# Patient Record
Sex: Female | Born: 1980 | Hispanic: No | Marital: Married | Smoking: Former smoker
Health system: Southern US, Community
[De-identification: ages and names within clinical notes are randomized; demographics above are authoritative.]

## PROBLEM LIST (undated history)

## (undated) ENCOUNTER — Inpatient Hospital Stay (HOSPITAL_COMMUNITY): Payer: Self-pay

## (undated) DIAGNOSIS — G43909 Migraine, unspecified, not intractable, without status migrainosus: Secondary | ICD-10-CM

## (undated) DIAGNOSIS — Z789 Other specified health status: Secondary | ICD-10-CM

## (undated) HISTORY — PX: APPENDECTOMY: SHX54

## (undated) HISTORY — PX: ADENOIDECTOMY: SUR15

## (undated) HISTORY — DX: Migraine, unspecified, not intractable, without status migrainosus: G43.909

## (undated) HISTORY — PX: TONSILLECTOMY: SUR1361

---

## 2010-06-29 ENCOUNTER — Emergency Department (HOSPITAL_COMMUNITY)
Admission: EM | Admit: 2010-06-29 | Discharge: 2010-06-30 | Payer: Self-pay | Source: Home / Self Care | Admitting: Emergency Medicine

## 2010-10-07 LAB — URINALYSIS, ROUTINE W REFLEX MICROSCOPIC
Glucose, UA: NEGATIVE mg/dL
Hgb urine dipstick: NEGATIVE
Protein, ur: NEGATIVE mg/dL
Specific Gravity, Urine: 1.011 (ref 1.005–1.030)

## 2010-10-07 LAB — POCT PREGNANCY, URINE: Preg Test, Ur: NEGATIVE

## 2011-01-14 LAB — RUBELLA ANTIBODY, IGM: Rubella: IMMUNE

## 2011-01-14 LAB — ABO/RH: RH Type: POSITIVE

## 2011-01-14 LAB — ANTIBODY SCREEN: Antibody Screen: NEGATIVE

## 2011-01-14 LAB — HEPATITIS B SURFACE ANTIGEN: Hepatitis B Surface Ag: NEGATIVE

## 2011-06-10 LAB — HIV ANTIBODY (ROUTINE TESTING W REFLEX): HIV: NONREACTIVE

## 2011-06-10 LAB — RUBELLA ANTIBODY, IGM: Rubella: IMMUNE

## 2011-06-10 LAB — ANTIBODY SCREEN: Antibody Screen: NEGATIVE

## 2011-06-10 LAB — RPR: RPR: NONREACTIVE

## 2011-06-10 LAB — ABO/RH

## 2011-06-10 LAB — HEPATITIS B SURFACE ANTIGEN: Hepatitis B Surface Ag: NEGATIVE

## 2011-07-28 NOTE — L&D Delivery Note (Signed)
Delivery Note At 11:52 AM a viable female was delivered via Vaginal, Spontaneous Delivery (Presentation: Right Occiput Anterior).  APGAR: 8, 9; weight .  8'2 Placenta status: Intact, Spontaneous.  Cord: 3 vessels with the following complications: None.  Cord pH: n/a  Anesthesia: Epidural  Episiotomy: None Lacerations: None Suture Repair: n/a Est. Blood Loss (mL): 350  Mom to postpartum.  Baby to nursery-stable.  Lecretia Buczek A. 09/01/2011, 12:59 PM

## 2011-08-03 LAB — STREP B DNA PROBE: GBS: NEGATIVE

## 2011-08-07 ENCOUNTER — Inpatient Hospital Stay (HOSPITAL_COMMUNITY)
Admission: AD | Admit: 2011-08-07 | Discharge: 2011-08-07 | Disposition: A | Discharge status: Home / Self Care | Payer: BC Managed Care – PPO | Source: Ambulatory Visit | Attending: Obstetrics | Admitting: Obstetrics

## 2011-08-07 ENCOUNTER — Encounter (HOSPITAL_COMMUNITY): Payer: Self-pay | Admitting: *Deleted

## 2011-08-07 DIAGNOSIS — O4700 False labor before 37 completed weeks of gestation, unspecified trimester: Secondary | ICD-10-CM | POA: Insufficient documentation

## 2011-08-07 HISTORY — DX: Other specified health status: Z78.9

## 2011-08-07 NOTE — Progress Notes (Signed)
"  UC's since 1730 that increased in intensity at about 2300.  UC's were every 1-2 mins at 2300 and now seem to be every 5-10 mins."

## 2011-08-07 NOTE — Progress Notes (Signed)
PT SAYS UC SINCE 530PM.  -   UC STOPPED- THEN STARTED AGAIN  AT 2300- CALLED DR .Marland Kitchen UC SLOWED DOWN.  VE ON Monday 1.5 CM.  DENIES HSV AND MRSA.

## 2011-08-30 ENCOUNTER — Encounter (HOSPITAL_COMMUNITY): Payer: Self-pay | Admitting: *Deleted

## 2011-08-30 ENCOUNTER — Inpatient Hospital Stay (HOSPITAL_COMMUNITY)
Admission: AD | Admit: 2011-08-30 | Discharge: 2011-08-30 | Disposition: A | Discharge status: Home / Self Care | Payer: BC Managed Care – PPO | Source: Ambulatory Visit | Attending: Obstetrics & Gynecology | Admitting: Obstetrics & Gynecology

## 2011-08-30 DIAGNOSIS — O99891 Other specified diseases and conditions complicating pregnancy: Secondary | ICD-10-CM | POA: Insufficient documentation

## 2011-08-30 DIAGNOSIS — H9210 Otorrhea, unspecified ear: Secondary | ICD-10-CM | POA: Insufficient documentation

## 2011-08-30 DIAGNOSIS — R42 Dizziness and giddiness: Secondary | ICD-10-CM | POA: Insufficient documentation

## 2011-08-30 NOTE — Progress Notes (Signed)
Has been leaking clear fluid from R ear x3 days.  C/o dizziness when lying down last night and today, hands going numb.

## 2011-08-30 NOTE — ED Provider Notes (Signed)
Heide Guile, RN reported that Dr. Seymour Bars asked that the NP perform an ear exam on Mrs. Minter.  She reports dizziness and liquid discharge from her right ear.   On exam the TMs are bilaterally without redness.  There is no fluid seen in either canal.  She has an appointment in the morning at 10:30.  Encouraged her to mention this visit with Dr. Ernestina Penna at that time.  Jeani Sow, RN FNP  Matt Holmes, NP 08/30/11 [phone]

## 2011-08-30 NOTE — Progress Notes (Signed)
Pt stated she woke up several times last night and felt like she was going to pass out. Arms and hand felt numb. Has bee very restless today and tried to take a nap and had a few more episoides of wanting to pass out. States if she closes her eyes  The room feels like it is spinning. Reports good  Fetal movement

## 2011-09-01 ENCOUNTER — Inpatient Hospital Stay (HOSPITAL_COMMUNITY)
Admission: AD | Admit: 2011-09-01 | Discharge: 2011-09-02 | DRG: 373 | Disposition: A | Discharge status: Home / Self Care | Payer: BC Managed Care – PPO | Source: Ambulatory Visit | Attending: Obstetrics | Admitting: Obstetrics

## 2011-09-01 ENCOUNTER — Encounter (HOSPITAL_COMMUNITY): Payer: Self-pay | Admitting: Anesthesiology

## 2011-09-01 ENCOUNTER — Encounter (HOSPITAL_COMMUNITY): Payer: Self-pay

## 2011-09-01 ENCOUNTER — Inpatient Hospital Stay (HOSPITAL_COMMUNITY): Payer: BC Managed Care – PPO | Admitting: Anesthesiology

## 2011-09-01 ENCOUNTER — Encounter (HOSPITAL_COMMUNITY): Payer: Self-pay | Admitting: *Deleted

## 2011-09-01 DIAGNOSIS — O878 Other venous complications in the puerperium: Principal | ICD-10-CM | POA: Diagnosis present

## 2011-09-01 DIAGNOSIS — K649 Unspecified hemorrhoids: Secondary | ICD-10-CM | POA: Diagnosis present

## 2011-09-01 LAB — CBC
Hemoglobin: 12.4 g/dL (ref 12.0–15.0)
Platelets: 173 10*3/uL (ref 150–400)
RBC: 4.11 MIL/uL (ref 3.87–5.11)

## 2011-09-01 LAB — TYPE AND SCREEN
ABO/RH(D): A POS
Antibody Screen: NEGATIVE

## 2011-09-01 LAB — RPR: RPR Ser Ql: NONREACTIVE

## 2011-09-01 MED ORDER — OXYTOCIN BOLUS FROM INFUSION
500.0000 mL | Freq: Once | INTRAVENOUS | Status: DC
  Filled 2011-09-01: qty 1000
  Filled 2011-09-01: qty 500

## 2011-09-01 MED ORDER — ONDANSETRON HCL 4 MG/2ML IJ SOLN: 4.0000 mg | Freq: Four times a day (QID) | INTRAMUSCULAR | Status: DC | PRN

## 2011-09-01 MED ORDER — ONDANSETRON HCL 4 MG PO TABS: 4.0000 mg | ORAL_TABLET | ORAL | Status: DC | PRN

## 2011-09-01 MED ORDER — DIPHENHYDRAMINE HCL 25 MG PO CAPS: 25.0000 mg | ORAL_CAPSULE | Freq: Four times a day (QID) | ORAL | Status: DC | PRN

## 2011-09-01 MED ORDER — CITRIC ACID-SODIUM CITRATE 334-500 MG/5ML PO SOLN: 30.0000 mL | ORAL | Status: DC | PRN

## 2011-09-01 MED ORDER — TETANUS-DIPHTH-ACELL PERTUSSIS 5-2.5-18.5 LF-MCG/0.5 IM SUSP: 0.5000 mL | Freq: Once | INTRAMUSCULAR | Status: DC

## 2011-09-01 MED ORDER — IBUPROFEN 600 MG PO TABS
600.0000 mg | ORAL_TABLET | Freq: Four times a day (QID) | ORAL | Status: DC
  Administered 2011-09-01 – 2011-09-02 (×5): 600 mg via ORAL
  Filled 2011-09-01 (×5): qty 1

## 2011-09-01 MED ORDER — DIBUCAINE 1 % RE OINT
1.0000 "application " | TOPICAL_OINTMENT | RECTAL | Status: DC | PRN
  Filled 2011-09-01: qty 28

## 2011-09-01 MED ORDER — PRENATAL MULTIVITAMIN CH
1.0000 | ORAL_TABLET | Freq: Every day | ORAL | Status: DC
  Administered 2011-09-01 – 2011-09-02 (×2): 1 via ORAL
  Filled 2011-09-01: qty 1

## 2011-09-01 MED ORDER — OXYTOCIN 20 UNITS IN LACTATED RINGERS INFUSION - SIMPLE
125.0000 mL/h | Freq: Once | INTRAVENOUS | Status: AC
  Administered 2011-09-01: 125 mL/h via INTRAVENOUS

## 2011-09-01 MED ORDER — FAMOTIDINE 20 MG PO TABS: 10.0000 mg | ORAL_TABLET | Freq: Two times a day (BID) | ORAL | Status: DC | PRN

## 2011-09-01 MED ORDER — FLEET ENEMA 7-19 GM/118ML RE ENEM: 1.0000 | ENEMA | Freq: Every day | RECTAL | Status: DC | PRN

## 2011-09-01 MED ORDER — ONDANSETRON HCL 4 MG/2ML IJ SOLN: 4.0000 mg | INTRAMUSCULAR | Status: DC | PRN

## 2011-09-01 MED ORDER — EPHEDRINE 5 MG/ML INJ
10.0000 mg | INTRAVENOUS | Status: DC | PRN
  Filled 2011-09-01: qty 4

## 2011-09-01 MED ORDER — LANOLIN HYDROUS EX OINT: TOPICAL_OINTMENT | CUTANEOUS | Status: DC | PRN

## 2011-09-01 MED ORDER — ACETAMINOPHEN 325 MG PO TABS: 650.0000 mg | ORAL_TABLET | ORAL | Status: DC | PRN

## 2011-09-01 MED ORDER — SIMETHICONE 80 MG PO CHEW: 80.0000 mg | CHEWABLE_TABLET | ORAL | Status: DC | PRN

## 2011-09-01 MED ORDER — LACTATED RINGERS IV SOLN
INTRAVENOUS | Status: DC
  Administered 2011-09-01: 06:00:00 via INTRAVENOUS

## 2011-09-01 MED ORDER — EPHEDRINE 5 MG/ML INJ: 10.0000 mg | INTRAVENOUS | Status: DC | PRN

## 2011-09-01 MED ORDER — FLEET ENEMA 7-19 GM/118ML RE ENEM: 1.0000 | ENEMA | RECTAL | Status: DC | PRN

## 2011-09-01 MED ORDER — LIDOCAINE HCL 1.5 % IJ SOLN
INTRAMUSCULAR | Status: DC | PRN
  Administered 2011-09-01 (×2): 5 mL via EPIDURAL

## 2011-09-01 MED ORDER — LACTATED RINGERS IV SOLN
500.0000 mL | Freq: Once | INTRAVENOUS | Status: AC
  Administered 2011-09-01: 500 mL via INTRAVENOUS

## 2011-09-01 MED ORDER — BENZOCAINE-MENTHOL 20-0.5 % EX AERO: 1.0000 "application " | INHALATION_SPRAY | CUTANEOUS | Status: DC | PRN

## 2011-09-01 MED ORDER — PHENYLEPHRINE 40 MCG/ML (10ML) SYRINGE FOR IV PUSH (FOR BLOOD PRESSURE SUPPORT)
80.0000 ug | PREFILLED_SYRINGE | INTRAVENOUS | Status: DC | PRN
  Filled 2011-09-01: qty 5

## 2011-09-01 MED ORDER — ZOLPIDEM TARTRATE 5 MG PO TABS: 5.0000 mg | ORAL_TABLET | Freq: Every evening | ORAL | Status: DC | PRN

## 2011-09-01 MED ORDER — PRENATAL MULTIVITAMIN CH
1.0000 | ORAL_TABLET | Freq: Every day | ORAL | Status: DC
  Filled 2011-09-01: qty 1

## 2011-09-01 MED ORDER — OXYCODONE-ACETAMINOPHEN 5-325 MG PO TABS: 1.0000 | ORAL_TABLET | ORAL | Status: DC | PRN

## 2011-09-01 MED ORDER — PHENYLEPHRINE 40 MCG/ML (10ML) SYRINGE FOR IV PUSH (FOR BLOOD PRESSURE SUPPORT): 80.0000 ug | PREFILLED_SYRINGE | INTRAVENOUS | Status: DC | PRN

## 2011-09-01 MED ORDER — IBUPROFEN 600 MG PO TABS: 600.0000 mg | ORAL_TABLET | Freq: Four times a day (QID) | ORAL | Status: DC | PRN

## 2011-09-01 MED ORDER — SENNOSIDES-DOCUSATE SODIUM 8.6-50 MG PO TABS
2.0000 | ORAL_TABLET | Freq: Every day | ORAL | Status: DC
  Administered 2011-09-01: 2 via ORAL

## 2011-09-01 MED ORDER — FENTANYL 2.5 MCG/ML BUPIVACAINE 1/10 % EPIDURAL INFUSION (WH - ANES)
INTRAMUSCULAR | Status: DC | PRN
  Administered 2011-09-01: 14 mL/h via EPIDURAL

## 2011-09-01 MED ORDER — WITCH HAZEL-GLYCERIN EX PADS
1.0000 "application " | MEDICATED_PAD | CUTANEOUS | Status: DC | PRN
  Administered 2011-09-02: 1 via TOPICAL

## 2011-09-01 MED ORDER — LIDOCAINE HCL (PF) 1 % IJ SOLN
30.0000 mL | INTRAMUSCULAR | Status: DC | PRN
  Filled 2011-09-01: qty 30

## 2011-09-01 MED ORDER — DIBUCAINE 1 % RE OINT
1.0000 "application " | TOPICAL_OINTMENT | RECTAL | Status: DC | PRN
  Administered 2011-09-02: 1 via RECTAL

## 2011-09-01 MED ORDER — IBUPROFEN 600 MG PO TABS
600.0000 mg | ORAL_TABLET | Freq: Four times a day (QID) | ORAL | Status: DC
  Administered 2011-09-01: 600 mg via ORAL

## 2011-09-01 MED ORDER — BISACODYL 10 MG RE SUPP: 10.0000 mg | Freq: Every day | RECTAL | Status: DC | PRN

## 2011-09-01 MED ORDER — CIPROFLOXACIN-HYDROCORTISONE 0.2-1 % OT SUSP
4.0000 [drp] | Freq: Two times a day (BID) | OTIC | Status: DC
  Administered 2011-09-01 – 2011-09-02 (×2): 4 [drp] via OTIC
  Filled 2011-09-01: qty 10

## 2011-09-01 MED ORDER — DIPHENHYDRAMINE HCL 50 MG/ML IJ SOLN: 12.5000 mg | INTRAMUSCULAR | Status: DC | PRN

## 2011-09-01 MED ORDER — FENTANYL 2.5 MCG/ML BUPIVACAINE 1/10 % EPIDURAL INFUSION (WH - ANES)
14.0000 mL/h | INTRAMUSCULAR | Status: DC
  Administered 2011-09-01: 14 mL/h via EPIDURAL
  Filled 2011-09-01 (×2): qty 60

## 2011-09-01 MED ORDER — BENZOCAINE-MENTHOL 20-0.5 % EX AERO
1.0000 "application " | INHALATION_SPRAY | CUTANEOUS | Status: DC | PRN
  Administered 2011-09-02: 1 via TOPICAL

## 2011-09-01 MED ORDER — LACTATED RINGERS IV SOLN: 500.0000 mL | INTRAVENOUS | Status: DC | PRN

## 2011-09-01 MED ORDER — WITCH HAZEL-GLYCERIN EX PADS: 1.0000 "application " | MEDICATED_PAD | CUTANEOUS | Status: DC | PRN

## 2011-09-01 NOTE — Progress Notes (Signed)
PT SAYS  WAS HERE 2 DAYS AGO- EAR INFECTION.  WAS IN DR OFFICE THIS AM- STRIPPED MEMBRANES- VE 3 CM.    DENIES HSV AND MRSA.

## 2011-09-01 NOTE — Progress Notes (Signed)
Delivery of live viable female by Dr Hilbert Corrigan

## 2011-09-01 NOTE — Progress Notes (Signed)
Pt just to labor floor. Still feeling +FM, no LOF, ctx  PE: Toco: q 5 min FH: 120s. + accels, no decels, 10 beat var Cvx: 4/50%/ vtx -2, AROM for clear fluids  A/P: Labor, augmentation for prolonged latent phase - AROM now, if no labor in 3 hrs will start pitocin - GBS neg - Reactive fetal testing  Amber Gonzalez A. 09/01/2011 5:58 AM

## 2011-09-01 NOTE — Anesthesia Preprocedure Evaluation (Signed)
Anesthesia Evaluation  Patient identified by MRN, date of birth, ID band Patient awake    Reviewed: Allergy & Precautions, H&P , NPO status , Patient's Chart, lab work & pertinent test results  Airway Mallampati: II TM Distance: >3 FB Neck ROM: full    Dental No notable dental hx.    Pulmonary neg pulmonary ROS,    Pulmonary exam normal       Cardiovascular neg cardio ROS     Neuro/Psych Negative Neurological ROS  Negative Psych ROS   GI/Hepatic negative GI ROS, Neg liver ROS,   Endo/Other  Negative Endocrine ROS  Renal/GU negative Renal ROS  Genitourinary negative   Musculoskeletal negative musculoskeletal ROS (+)   Abdominal Normal abdominal exam  (+)   Peds negative pediatric ROS (+)  Hematology negative hematology ROS (+)   Anesthesia Other Findings   Reproductive/Obstetrics (+) Pregnancy                           Anesthesia Physical Anesthesia Plan  ASA: II  Anesthesia Plan: Epidural   Post-op Pain Management:    Induction:   Airway Management Planned:   Additional Equipment:   Intra-op Plan:   Post-operative Plan:   Informed Consent: I have reviewed the patients History and Physical, chart, labs and discussed the procedure including the risks, benefits and alternatives for the proposed anesthesia with the patient or authorized representative who has indicated his/her understanding and acceptance.     Plan Discussed with:   Anesthesia Plan Comments:         Anesthesia Quick Evaluation  

## 2011-09-01 NOTE — H&P (Signed)
Amber Gonzalez is a 31 y.o. G2P1001 at [redacted]w[redacted]d by 8 wk u/s NCWD presenting for labor. Pt notes contractions for the past month, has been seen several times in [facility]U and for unscheduled visits for painful contractions. Contractions got worse at 10am after "membrane stripping" in the office. Pt declined IOL at that time. Pt notes contractions have persisted at q 5 min for the past 20 hrs. Also noticing bloody vaginal d/c.  Good fetal movement and not leaking fluid.  [facility] since 8 wks - anxiety, has remained off meds this pregnancy but anxiety remained an issue with pt seeking additional visits and specialty consult for multiple symptoms.  - chronic ear infection, sometimes leads to dizziness, no meds this preg - heart palpitations throughout 1st/2nd trimester. Normal thyroid, DS, cards eval w/ holter - joint pain and mild elevation in uric acid in 2nd trimester. Pt w/ nl work-up by rheumatology for gout  OB History    Grav Para Term Preterm Abortions TAB SAB Ect Mult Living   2 1 1       1      Past Medical History  Diagnosis Date  . No pertinent past medical history    Past Surgical History  Procedure Date  . Appendectomy   . Tonsillectomy   . Adenoidectomy    Family History: family history includes Anesthesia problems in her other.  There is no history of Hypotension, and Malignant hyperthermia, and Pseudochol deficiency, . Social History:  reports that she quit smoking about 13 years ago. Her smoking use included Cigarettes. She has a 3 pack-year smoking history. She does not have any smokeless tobacco history on file. She reports that she does not drink alcohol or use illicit drugs.  Meds: PNV, Zantac, Flonase  Review of Systems - Negative except contractions, spotting   Dilation: 4 Effacement (%): 40 Station: -3 Exam by:: L. Munford RN Blood pressure 122/75, pulse 78, temperature 97.8 F (36.6 C), temperature source Oral, resp. rate 20, height 5' (1.524 m), weight  75.921 kg (167 lb 6 oz).  Physical Exam:  Gen: well appearing, no distress CV: RRR Pulm: CTAB Back: no CVAT Abd: gravid, NT, no RUQ pain LE: tr edema, equal bilaterally, non-tender Toco: q 5 min FH: baseline 120s, accelerations present, no deceleratons, 10 beat variability  Prenatal labs: ABO, Rh:   Antibody:   Rubella:   RPR:    HBsAg:    HIV:    GBS:    1 hr Glucola 100  Genetic screening nl quad screen Anatomy US nl female A+, RI, RPR NR, HIV neg, Hep B neg, GC/CT neg, GBS neg   Assessment/Plan: 31 y.o. G2P1001 at [redacted]w[redacted]d - Reactive fetal testing - Latent labor. Pt has been contracting for the past month. Now slowly entering active phase. Plan on admission and ROM to help get pt into active labor, will add pitocin if needed - GBS neg - Anxiety, plan SS PP   Shaheer Bonfield A. 09/01/2011, 5:09 AM

## 2011-09-01 NOTE — Progress Notes (Signed)
S: pain 2/10 after epidural 1 hr ago, still feeling ctx but able to rest  O: Filed Vitals:   09/01/11 0901  BP: 121/68  Pulse: 93  Temp:   Resp: 18   Cvx: 8/100/0/ vtx Toco: q 3 min FH: 120's, + accels, no decels, 10 beat var  A/P: Active labor now s/p AROM with good cervical change - passive descent - epidural - reactive fetal testing  Kjuan Seipp A. 09/01/2011 9:20 AM

## 2011-09-01 NOTE — Anesthesia Procedure Notes (Signed)
Epidural Patient location during procedure: OB Start time: 09/01/2011 7:42 AM End time: 09/01/2011 7:48 AM Reason for block: procedure for pain  Staffing Anesthesiologist: Sandrea Hughs Performed by: anesthesiologist   Preanesthetic Checklist Completed: patient identified, site marked, surgical consent, pre-op evaluation, timeout performed, IV checked, risks and benefits discussed and monitors and equipment checked  Epidural Patient position: sitting Prep: site prepped and draped and DuraPrep Patient monitoring: continuous pulse ox and blood pressure Approach: midline Injection technique: LOR air  Needle:  Needle type: Tuohy  Needle gauge: 17 G Needle length: 9 cm Needle insertion depth: 4 cm Catheter type: closed end flexible Catheter size: 19 Gauge Catheter at skin depth: 9 cm Test dose: negative and 1.5% lidocaine  Assessment Sensory level: T10 Events: blood not aspirated, injection not painful, no injection resistance, negative IV test and no paresthesia

## 2011-09-01 NOTE — Anesthesia Postprocedure Evaluation (Signed)
Anesthesia Post Note  Patient: Amber Gonzalez  Procedure(s) Performed: * No procedures listed *  Anesthesia type: Epidural  Patient location: Mother/Baby  Post pain: Pain level controlled  Post assessment: Post-op Vital signs reviewed  Last Vitals:  Filed Vitals:   09/01/11 1303  BP: 123/76  Pulse: 114  Temp:   Resp: 16    Post vital signs: Reviewed  Level of consciousness: awake  Complications: No apparent anesthesia complications

## 2011-09-02 ENCOUNTER — Encounter (HOSPITAL_COMMUNITY): Payer: Self-pay

## 2011-09-02 LAB — CBC
HCT: 31.7 % — ABNORMAL LOW (ref 36.0–46.0)
MCH: 30.1 pg (ref 26.0–34.0)
MCV: 89 fL (ref 78.0–100.0)
RBC: 3.56 MIL/uL — ABNORMAL LOW (ref 3.87–5.11)
RDW: 13.3 % (ref 11.5–15.5)
WBC: 11.8 10*3/uL — ABNORMAL HIGH (ref 4.0–10.5)

## 2011-09-02 MED ORDER — HYDROCORTISONE ACE-PRAMOXINE 1-1 % RE FOAM: 1.0000 | Freq: Two times a day (BID) | RECTAL | Status: AC

## 2011-09-02 MED ORDER — BENZOCAINE-MENTHOL 20-0.5 % EX AERO
INHALATION_SPRAY | CUTANEOUS | Status: AC
  Filled 2011-09-02: qty 56

## 2011-09-02 MED ORDER — BISACODYL 10 MG RE SUPP: 10.0000 mg | Freq: Every day | RECTAL | Status: AC | PRN

## 2011-09-02 MED ORDER — HYDROCORTISONE ACE-PRAMOXINE 1-1 % RE FOAM: 1.0000 | Freq: Two times a day (BID) | RECTAL | Status: DC

## 2011-09-02 MED ORDER — IBUPROFEN 600 MG PO TABS: 600.0000 mg | ORAL_TABLET | Freq: Four times a day (QID) | ORAL | Status: AC

## 2011-09-02 NOTE — Progress Notes (Signed)
PPD 1 SVD  S:  Reports feeling well. (+) hemorrhoids pain. Desires early D/C.             Tolerating po/ No nausea or vomiting             Bleeding is moderate             Pain controlled with motrin.             Up ad lib / ambulatory  Newborn  Information for the patient's newborn:  Eveleigh, [place] Girl Almas [[id number]]  female  breast feeding  / infant w/o BM yet   O:  A & O x 3 NAD             VS: Blood pressure 116/78, pulse 77, temperature 98.4 F (36.9 C), temperature source Oral, resp. rate 18, height 5' (1.524 m), weight 167 lb 6 oz (75.921 kg), SpO2 97.00%, unknown if currently breastfeeding.  LABS:  Basename 09/02/11 0550 09/01/11 0520  HGB 10.7* 12.4  HCT 31.7* 36.4    I&O: I/O last 3 completed shifts: In: -  Out: 350 [Blood:350]      Lungs: Clear and unlabored  Heart: regular rate and rhythm / no mumurs  Abdomen: soft, non-tender, non-distended              Fundus: firm, non-tender, U-2  Perineum: intact, (+) large hemorrhoids, dark pink, no thrombosis, ice pack in place  Lochia: small  Extremities: trace edema, no calf pain or tenderness, neg Homans    A/P: PPD # 1 30 y.o., L2G4010 S/P:spontaneous vaginal   Principal Problem:  *PP care - s/p SVD 2/5   Doing well - stable status  Routine post partum orders  Hemorrhoids  Proctofoam HC TID  Sitz baths BID x 1 week  Will D/C patient home pending D/C of NB by peds.   Arlan Organ CNM, MSN 09/02/2011, 10:41 AM

## 2011-09-02 NOTE — Discharge Summary (Signed)
Obstetric Discharge Summary Reason for Admission: onset of labor Prenatal Procedures: ultrasound Intrapartum Procedures: spontaneous vaginal delivery Postpartum Procedures: none Complications-Operative and Postpartum: none Hemoglobin  Date Value Range Status  09/02/2011 10.7* 12.0-15.0 (g/dL) Final     HCT  Date Value Range Status  09/02/2011 31.7* 36.0-46.0 (%) Final    Discharge Diagnoses: Term Pregnancy-delivered and hemorrhoids  Discharge Information: Date: 09/02/2011 Activity: pelvic rest Diet: routine Medications: PNV, Ibuprofen and Proctofoam HC Condition: stable Instructions: refer to practice specific booklet Discharge to: home Follow-up Information    Follow up with [facility] A., MD. Schedule an appointment as soon as possible for a visit in 6 weeks.   Contact information:   [address] [place] [address] [phone]          Newborn Data: Live born female "[name]" Birth Weight: 8 lb 2 oz (3685 g) APGAR: 8, 9  Home with mother.  PAUL,DANIELA 09/02/2011, 10:50 AM

## 2011-09-02 NOTE — Anesthesia Postprocedure Evaluation (Signed)
  Anesthesia Post-op Note  Patient: Amber Gonzalez  Procedure(s) Performed: * No procedures listed *  Patient Location: PACU and Women's Unit  Anesthesia Type: Epidural  Level of Consciousness: awake, alert  and oriented  Airway and Oxygen Therapy: Patient Spontanous Breathing  Post-op Pain: none  Post-op Assessment: Post-op Vital signs reviewed and Patient's Cardiovascular Status Stable  Post-op Vital Signs: Reviewed and stable  Complications: No apparent anesthesia complications

## 2011-09-02 NOTE — Addendum Note (Signed)
Addendum  created 09/02/11 0619 by Emalee Knies, CRNA   Modules edited:Notes Section    

## 2011-09-02 NOTE — Addendum Note (Signed)
Addendum  created 09/02/11 1610 by Pat Patrick, CRNA   Modules edited:Notes Section

## 2013-06-05 ENCOUNTER — Other Ambulatory Visit: Payer: Self-pay | Admitting: Family Medicine

## 2013-06-05 ENCOUNTER — Ambulatory Visit
Admission: RE | Admit: 2013-06-05 | Discharge: 2013-06-05 | Disposition: A | Discharge status: Home / Self Care | Payer: Managed Care, Other (non HMO) | Source: Ambulatory Visit | Attending: Family Medicine | Admitting: Family Medicine

## 2013-06-05 DIAGNOSIS — M542 Cervicalgia: Secondary | ICD-10-CM

## 2013-06-05 DIAGNOSIS — M25561 Pain in right knee: Secondary | ICD-10-CM

## 2013-06-05 DIAGNOSIS — R221 Localized swelling, mass and lump, neck: Secondary | ICD-10-CM

## 2013-06-07 ENCOUNTER — Other Ambulatory Visit: Payer: Self-pay | Admitting: Family Medicine

## 2013-06-07 DIAGNOSIS — E041 Nontoxic single thyroid nodule: Secondary | ICD-10-CM

## 2013-06-13 ENCOUNTER — Other Ambulatory Visit (HOSPITAL_COMMUNITY)
Admission: RE | Admit: 2013-06-13 | Discharge: 2013-06-13 | Disposition: A | Discharge status: Home / Self Care | Payer: Managed Care, Other (non HMO) | Source: Ambulatory Visit | Attending: Interventional Radiology | Admitting: Interventional Radiology

## 2013-06-13 ENCOUNTER — Ambulatory Visit
Admission: RE | Admit: 2013-06-13 | Discharge: 2013-06-13 | Disposition: A | Discharge status: Home / Self Care | Payer: Managed Care, Other (non HMO) | Source: Ambulatory Visit | Attending: Family Medicine | Admitting: Family Medicine

## 2013-06-13 DIAGNOSIS — E041 Nontoxic single thyroid nodule: Secondary | ICD-10-CM

## 2013-07-10 ENCOUNTER — Ambulatory Visit (INDEPENDENT_AMBULATORY_CARE_PROVIDER_SITE_OTHER): Payer: Managed Care, Other (non HMO) | Admitting: Surgery

## 2013-07-10 ENCOUNTER — Encounter (INDEPENDENT_AMBULATORY_CARE_PROVIDER_SITE_OTHER): Payer: Self-pay | Admitting: Surgery

## 2013-07-10 ENCOUNTER — Encounter (INDEPENDENT_AMBULATORY_CARE_PROVIDER_SITE_OTHER): Payer: Self-pay

## 2013-07-10 VITALS — BP 126/84 | HR 80 | Temp 97.6°F | Resp 17 | Ht 60.0 in | Wt 138.6 lb

## 2013-07-10 DIAGNOSIS — E041 Nontoxic single thyroid nodule: Secondary | ICD-10-CM

## 2013-07-10 NOTE — Patient Instructions (Signed)

## 2013-07-10 NOTE — Progress Notes (Signed)
General Surgery [facility]  Chief Complaint  Patient presents with  . Thyroid Nodule    new evaluation - referral from Dr. Maryelizabeth Rowan    HISTORY: Patient is a 32 year old female referred by her primary care physician for evaluation of newly diagnosed thyroid nodule. Patient had had minor blunt trauma to the neck. She noted "soreness" on the left side. She was evaluated by her primary care physician who noted a size discrepancy in the 2 lobes of the thyroid gland. Patient was sent for thyroid ultrasound which showed a slightly larger left thyroid lobe measuring 5.4 cm. It contained a solid nodule measuring 1.6 cm in greatest dimension and contained course calcifications. Right thyroid lobe was normal without nodularity. Lymph nodes were normal.  Patient underwent ultrasound-guided fine-needle aspiration biopsy on 06/13/2013. This showed a follicular lesion with rare micro-follicles and was felt to likely represent an adenomatous nodule. No atypia or evidence of malignancy was identified.  Patient has no prior history of thyroid disease. She has never been on thyroid medication. She has had no prior head or neck surgery.  There is a family history of hypothyroidism and in maternal grandmother. There is no family history of thyroid malignancy. There is no family history of pituitary or adrenal tumors.  Past Medical History  Diagnosis Date  . No pertinent past medical history   . PP care - s/p SVD 2/5 09/02/2011  . Migraines     Current Outpatient Prescriptions  Medication Sig Dispense Refill  . escitalopram (LEXAPRO) 10 MG tablet daily.       No current facility-administered medications for this visit.    Allergies  Allergen Reactions  . Iodine Anaphylaxis and Swelling    Family History  Problem Relation Age of Onset  . Anesthesia problems Other   . Hypotension Neg Hx   . Malignant hyperthermia Neg Hx   . Pseudochol deficiency Neg Hx   . Diabetes Mother      History   Social History  . Marital Status: Married    Spouse Name: N/A    Number of Children: N/A  . Years of Education: N/A   Social History Main Topics  . Smoking status: Former Smoker -- 0.50 packs/day for 6 years    Types: Cigarettes    Quit date: 08/06/1998  . Smokeless tobacco: None  . Alcohol Use: No     Comment: "Not drinking in pregnancy"  . Drug Use: No  . Sexual Activity: Yes    Birth Control/ Protection: None   Other Topics Concern  . None   Social History Narrative  . None    REVIEW OF SYSTEMS - PERTINENT POSITIVES ONLY: Mild soreness left neck. Denies tremor. Denies palpitations. Denies compressive symptoms.  EXAM: Filed Vitals:   07/10/13 1139  BP: 126/84  Pulse: 80  Temp: 97.6 F (36.4 C)  Resp: 17    GENERAL: well-developed, well-nourished, no acute distress HEENT: normocephalic; pupils equal and reactive; sclerae clear; dentition good; mucous membranes moist NECK:  Palpable firm nodule mid left thyroid lobe, approximately 1.5 cm, mobile with swallowing, mildly tender; asymmetric on extension; no palpable anterior or posterior cervical lymphadenopathy; no supraclavicular masses; no tenderness CHEST: clear to auscultation bilaterally without rales, rhonchi, or wheezes CARDIAC: regular rate and rhythm without significant murmur; peripheral pulses are full EXT:  non-tender without edema; no deformity NEURO: no gross focal deficits; no sign of tremor   LABORATORY RESULTS: See [facility] ([facility]Epic) for most recent results  RADIOLOGY RESULTS:  See [facility] ([facility]L-Epic) for most recent results  IMPRESSION: Left thyroid nodule, 1.6 cm, with probable benign cytopathology  PLAN: I discussed the above findings at length with the patient and her husband. I provided him with written literature to review at home.  I have recommended close observation. I would like to repeat a thyroid ultrasound in 6 months. If there is any change in size  or character of the lesion, we may consider surgical excision for definitive diagnosis. Otherwise I believe this can be safely monitored. Patient and her husband are comfortable with this approach.  We will make arrangements for thyroid ultrasound at [facility] in 6 months and I will see the patient in the office following that study for physical examination.  Velora Heckler, MD, FACS General & Endocrine Surgery [facility]  Primary Care Physician: No primary provider on file.

## 2013-07-11 ENCOUNTER — Telehealth (INDEPENDENT_AMBULATORY_CARE_PROVIDER_SITE_OTHER): Payer: Self-pay | Admitting: *Deleted

## 2013-07-11 NOTE — Telephone Encounter (Signed)
I spoke with pt to inform her of the appt for her f/u US at [facility] on 01/10/14 with an arrival time of 11:15am.  Pt is agreeable with this appt.

## 2014-01-10 ENCOUNTER — Ambulatory Visit
Admission: RE | Admit: 2014-01-10 | Discharge: 2014-01-10 | Disposition: A | Discharge status: Home / Self Care | Payer: Managed Care, Other (non HMO) | Source: Ambulatory Visit | Attending: Surgery | Admitting: Surgery

## 2014-01-10 DIAGNOSIS — E041 Nontoxic single thyroid nodule: Secondary | ICD-10-CM

## 2014-02-20 ENCOUNTER — Encounter (INDEPENDENT_AMBULATORY_CARE_PROVIDER_SITE_OTHER): Payer: Self-pay | Admitting: Surgery

## 2014-02-20 ENCOUNTER — Ambulatory Visit (INDEPENDENT_AMBULATORY_CARE_PROVIDER_SITE_OTHER): Payer: Managed Care, Other (non HMO) | Admitting: Surgery

## 2014-02-20 VITALS — BP 122/74 | HR 80 | Temp 98.0°F | Ht 60.0 in | Wt 141.0 lb

## 2014-02-20 DIAGNOSIS — E041 Nontoxic single thyroid nodule: Secondary | ICD-10-CM

## 2014-02-20 NOTE — Patient Instructions (Signed)
Thyroid-Stimulating Hormone A thyroid-stimulating hormone (TSH) test is a blood test that is done to measure the level of TSH, also known as thyrotropin, in your blood. Knowing the level of TSH in your blood can help your health care provider:  Diagnose a thyroid gland or pituitary gland disorder.  Manage your condition and treatment if you have hypothyroidism or hyperthyroidism. TSH is produced by the pituitary gland. The pituitary gland is a small organ located just below the brain, behind your eyes and nasal passages. It is part of a system that monitors and maintains thyroid hormone levels and thyroid gland function. Thyroid hormones affect many body parts and systems, including the system that affects how quickly your body burns fuel for energy. Your health care provider may recommend testing your TSH level if you have signs and symptoms that are often associated with abnormal thyroid hormone levels. The blood used for testing is obtained through a needle placed in a vein in your arm. RESULTS It is your responsibility to obtain your test results. Ask the lab or department performing the test when and how you will get your results. Contact your health care provider to discuss any questions you have about your results.  Your health care provider will go over the test results with you and discuss the importance and meaning of your results, as well as the need for additional tests if necessary. Range of Normal Values  Ranges for normal values may vary among different labs and hospitals. You should always check with your health care provider after having lab work or other tests done to discuss whether your values are considered within normal limits.  Adult: 0.3-5 microU/mL or 0.3-5 mU/L (SI units).  Newborn: 3-18 microU/mL or 3-18 mU/L.  Cord: 3-12 microU/mL or 3-12 mU/L. Meaning of Results Outside Normal Value Ranges A high level of TSH may mean:  Your thyroid gland is not making enough  thyroid hormones. When the thyroid gland does not make enough thyroid hormones, the pituitary gland releases TSH into the bloodstream. The higher-than-normal levels of TSH prompt the thyroid gland to release more thyroid hormones.  You are getting an insufficient level of thyroid hormone medicine, if you are receiving this type of treatment.  There is a problem with the pituitary gland (rare). A low level of TSH can indicate a problem with the pituitary gland. Document Released: 08/07/2004 Document Revised: 11/27/2013 Document Reviewed: 06/24/2008 ExitCare Patient Information 2015 [employer]. This information is not intended to replace advice given to you by your health care provider. Make sure you discuss any questions you have with your health care provider.  

## 2014-02-20 NOTE — Progress Notes (Signed)
General Surgery [facility]  Chief Complaint  Patient presents with  . Follow-up    left thyroid nodule    HISTORY: Patient is a pleasant 33 year old female with a known left thyroid nodule. She was evaluated in late 2014. This included fine-needle aspiration biopsy which had benign follicular cytopathology.  At my request the patient underwent an ultrasound on 01/10/2014. This shows a normal right thyroid lobe. Left thyroid lobe shows a 16 mm nodule with coarse calcifications which is unchanged to slightly smaller compared with her prior study of November 2014.  PERTINENT REVIEW OF SYSTEMS: Patient complains of intermittent sharp pain of her left neck near the angle of the mandible occurring weekly. No significant dysphagia. Denies tremor. Denies palpitations.  EXAM: HEENT: normocephalic; pupils equal and reactive; sclerae clear; dentition good; mucous membranes moist NECK:  Palpation reveals a smooth slightly greater than 1 cm nodule in the upper portion of the left thyroid lobe; right lobe without palpable abnormality; symmetric on extension; no palpable anterior or posterior cervical lymphadenopathy; no supraclavicular masses; no tenderness CHEST: clear to auscultation bilaterally without rales, rhonchi, or wheezes CARDIAC: regular rate and rhythm without significant murmur; peripheral pulses are full EXT:  non-tender without edema; no deformity NEURO: no gross focal deficits; no sign of tremor   IMPRESSION: #1 left thyroid nodule, 16 mm, unchanged from prior studies #2 left neck discomfort, region of the hyoid bone, of undetermined etiology  PLAN: The patient and I reviewed her study here in the office. Her thyroid nodule is stable. He can be safely observed. I have recommended repeat ultrasound and TSH level in one year. I will see her back for physical examination following those studies.  Given her history of ear infections and the persistent upper left  neck pain, I have given her the names of [facility] surgeons for consultation. Patient will arrange this at her convenience.  Velora Hecklerodd M. Rosalie Buenaventura, MD, [facility] Office: [phone]  Visit Diagnoses: 1. Thyroid nodule, uninodular, left lobe

## 2014-05-28 ENCOUNTER — Encounter (INDEPENDENT_AMBULATORY_CARE_PROVIDER_SITE_OTHER): Payer: Self-pay | Admitting: Surgery

## 2014-12-22 IMAGING — US US SOFT TISSUE HEAD/NECK
1 series · 14 of 25 positions shown · non-contrast
Comparison: None.

CLINICAL DATA: Neck swelling

EXAM:
THYROID ULTRASOUND
TECHNIQUE: Ultrasound examination of the thyroid gland and adjacent soft
tissues was performed.

[Series 1: us soft tissue head/neck · 0.05mm/px · 14 of 55 slices shown]
[im 1/55]
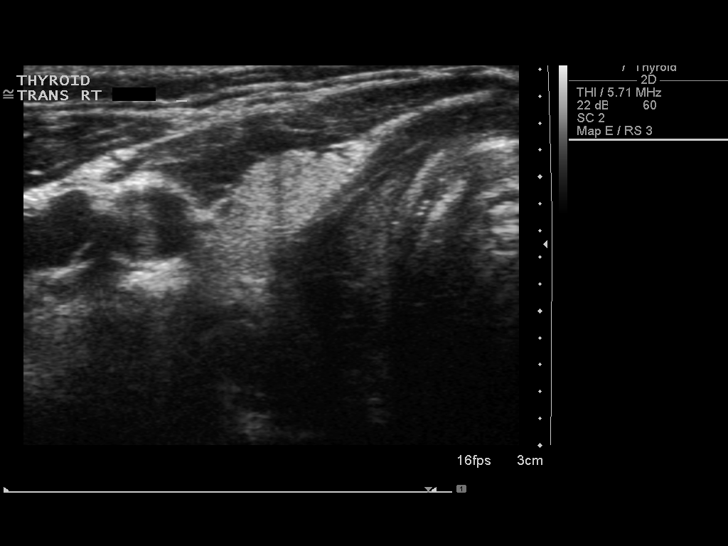
[im 5/55]
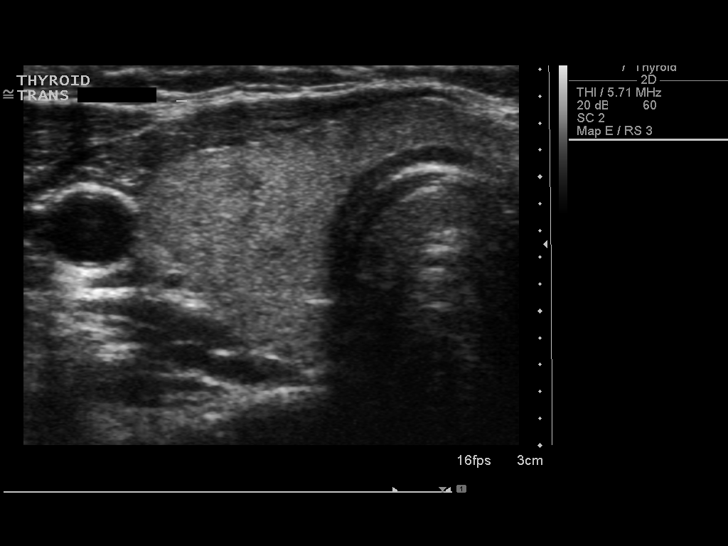
[im 10/55]
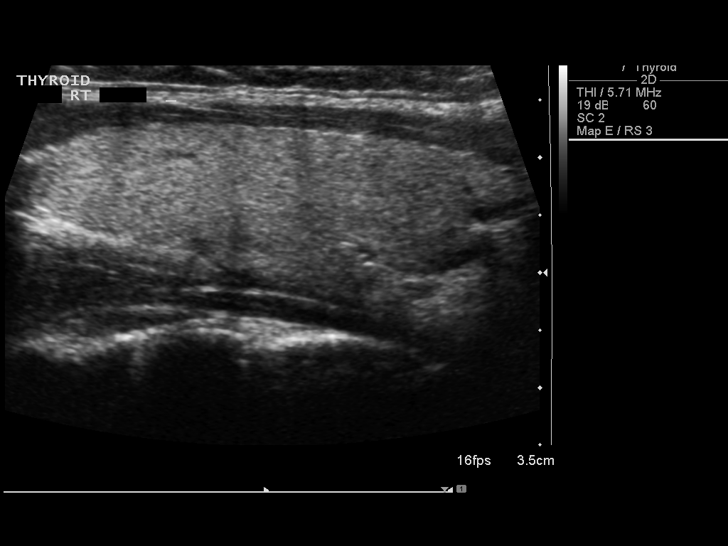
[im 14/55]
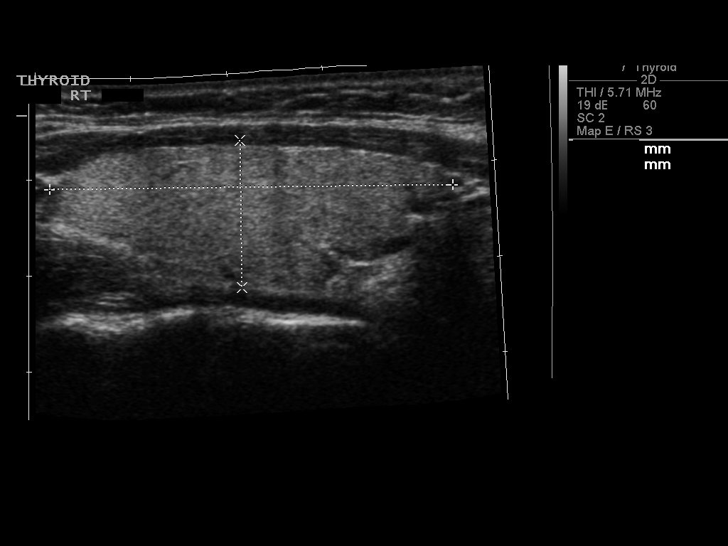
[im 19/55]
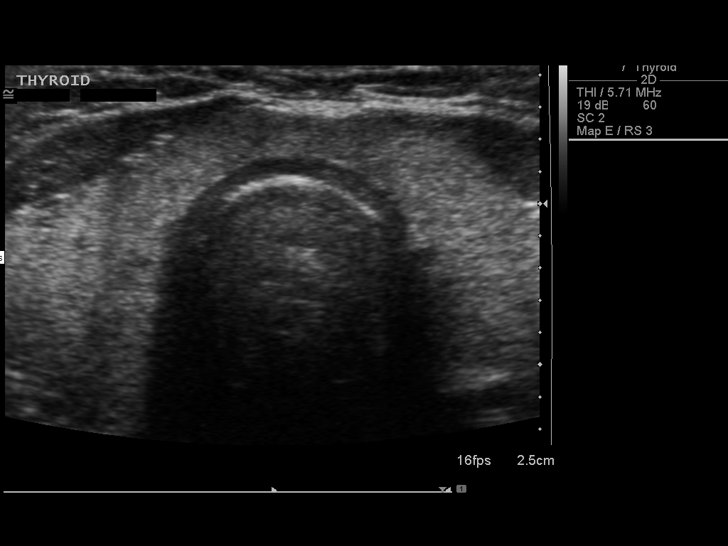
[im 21/55]
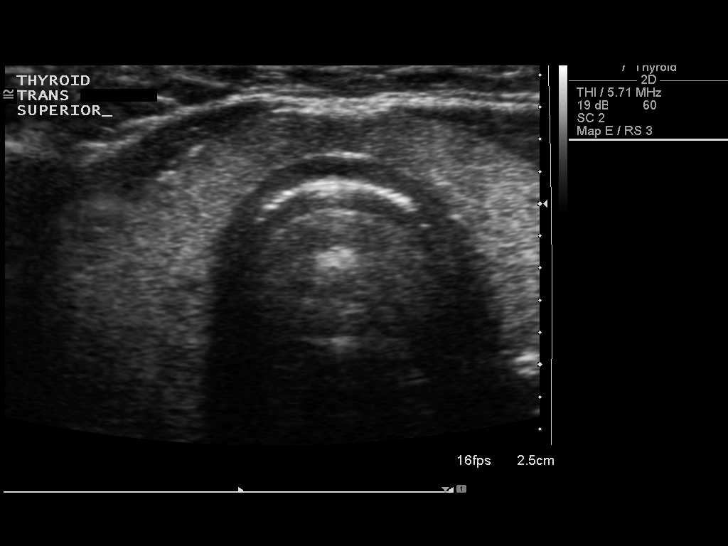
[im 25/55]
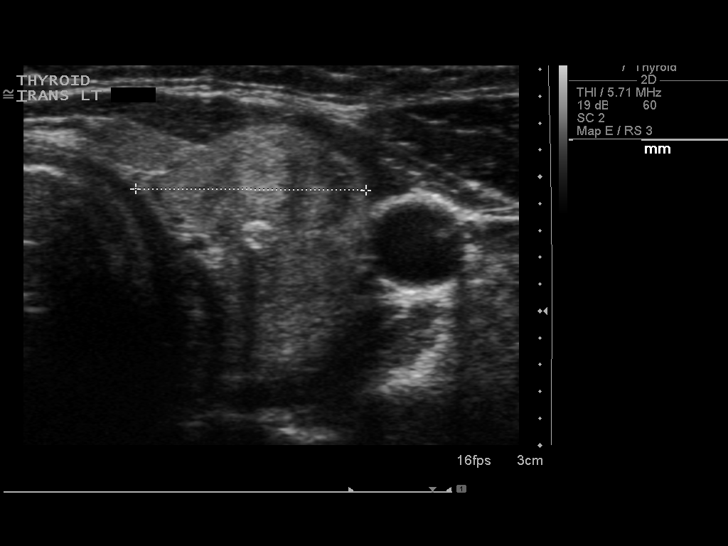
[im 30/55]
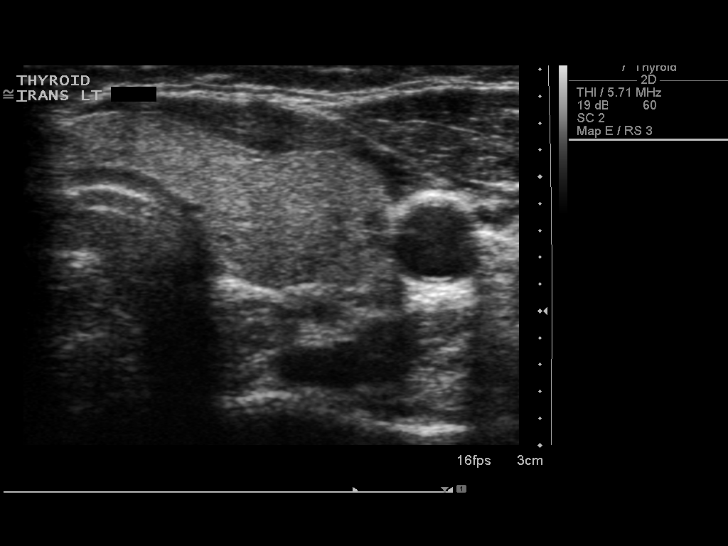
[im 34/55]
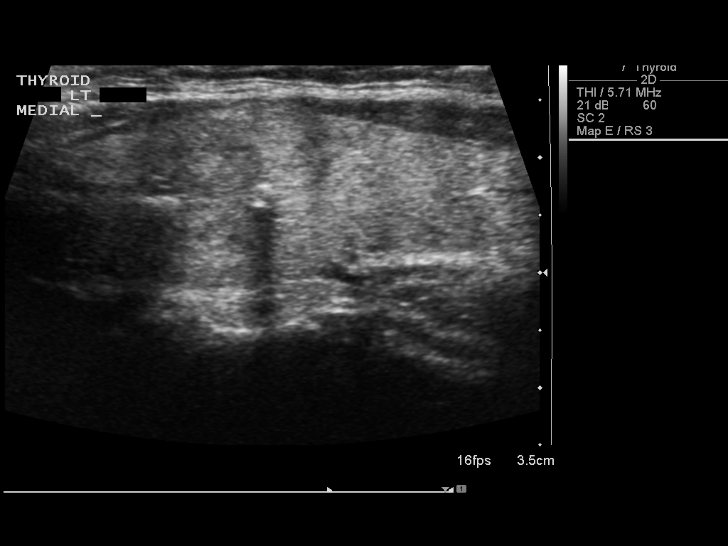
[im 37/55]
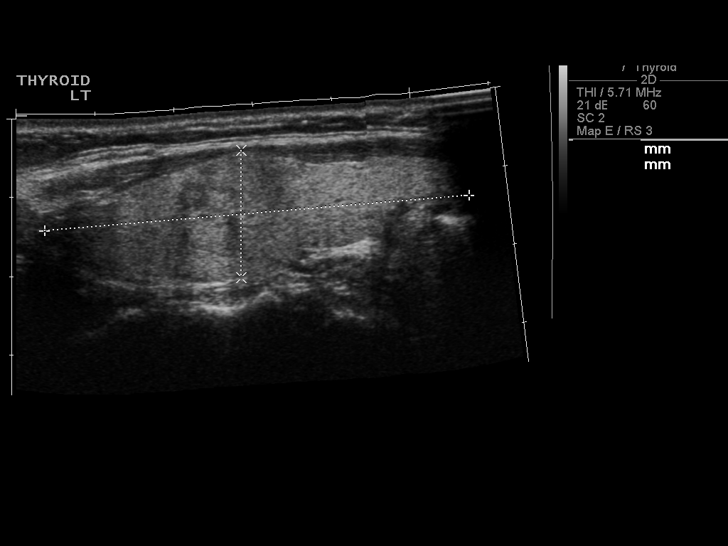
[im 41/55]
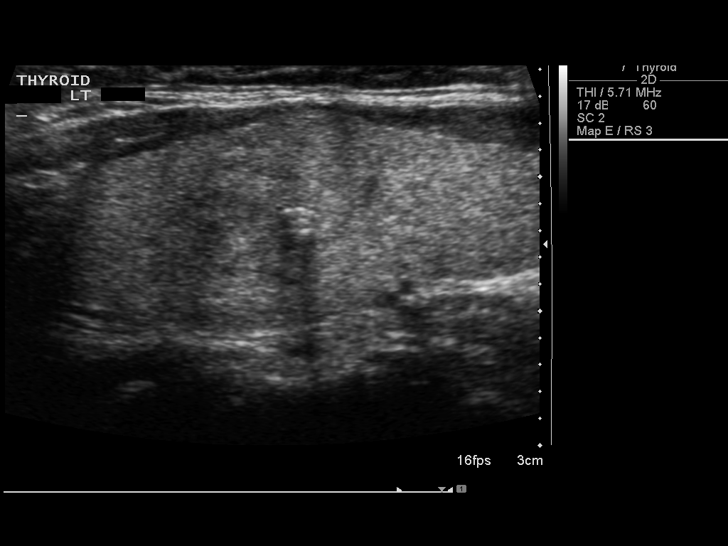
[im 46/55]
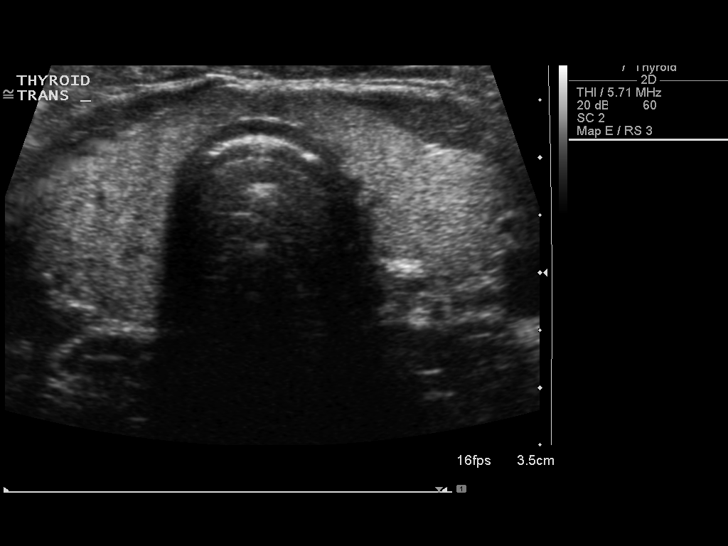
[im 50/55]
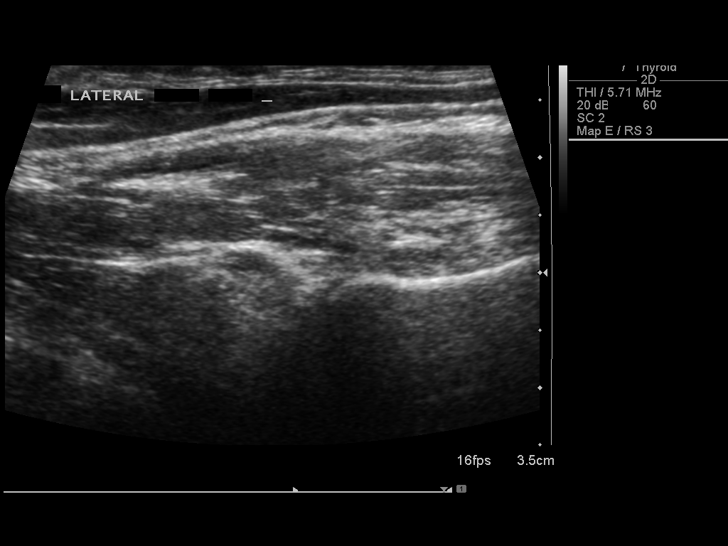
[im 55/55]
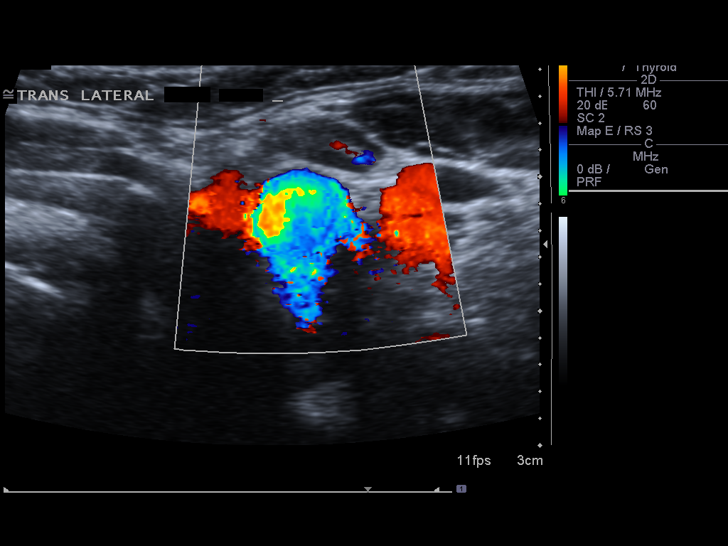

[14 of 25 positions shown; findings below may reference images not displayed]

FINDINGS: Right thyroid lobe

Measurements: 4.5 x 1.5 x 1.6 cm..  No nodules visualized.

Left thyroid lobe

Measurements: 5.4 x 1.6 x 1.7 cm.. Solid nodule left midpole
measures 16 x 16 x 13 mm. Coarse calcification present within the
nodule.

Isthmus

Thickness: 0.3 cm..  No nodules visualized.

Lymphadenopathy

Left lateral neck node with  fatty hilum measures 5 mm in thickness.
IMPRESSION: 16 x 16 x 13 mm solid nodule left midpole thyroid. Findings meet
consensus criteria for biopsy. Ultrasound-guided fine needle
aspiration should be considered, as per the consensus statement:
Management of Thyroid Nodules Detected at US: Society of
Radiologists in Ultrasound Consensus Conference Statement. Radiology
[id number]; [DATE].

.

## 2014-12-30 IMAGING — US US THYROID BIOPSY
1 series · 13 of 14 positions shown · non-contrast
Comparison: Thyroid Ultrasound - 06/05/2013

COMPLICATIONS:
None immediate

INDICATION: Indeterminate thyroid nodule

EXAM:
ULTRASOUND GUIDED FINE NEEDLE ASPIRATION OF INDETERMINATE THYROID
NODULE
MEDICATIONS:
None
TECHNIQUE: Informed written consent was obtained from the patient after a
discussion of the risks, benefits and alternatives to treatment.
Questions regarding the procedure were encouraged and answered. A
timeout was performed prior to the initiation of the procedure.

[Series 1: us thyroid biopsy · 0.05mm/px · 14 acquisitions, 13 frames shown]
[im 1/14]
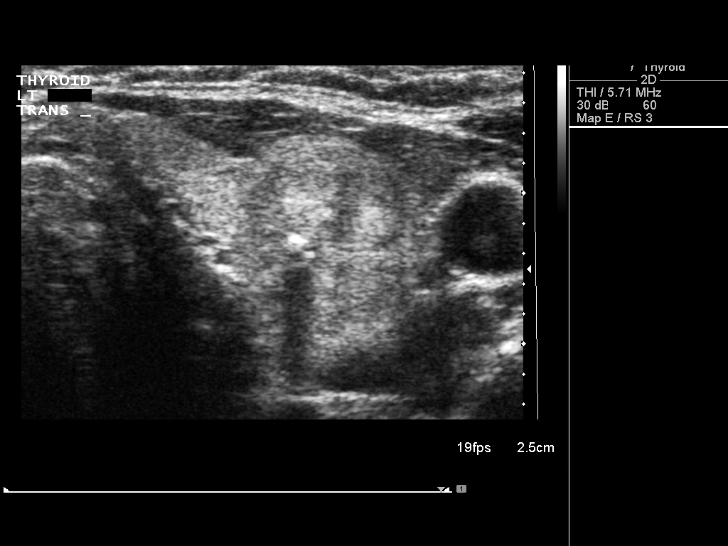
[im 2/14]
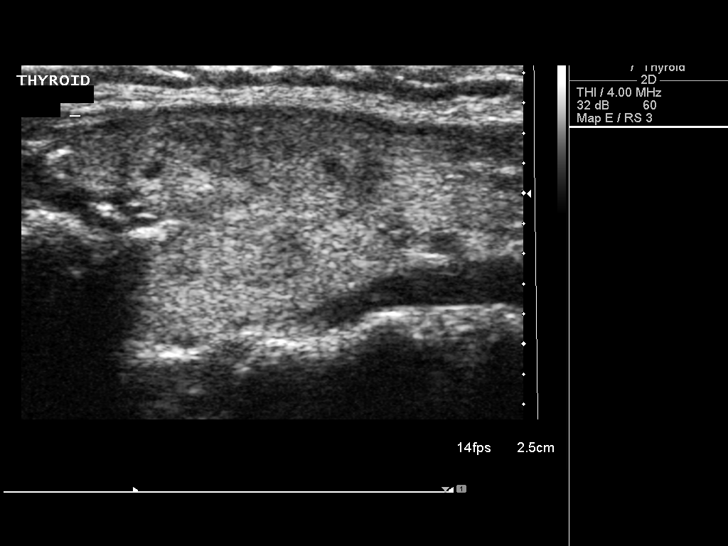
[im 3/14]
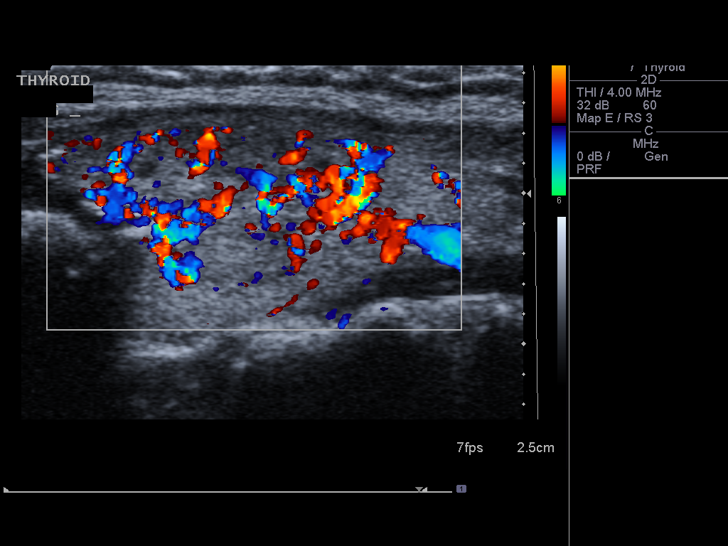
[im 4/14]
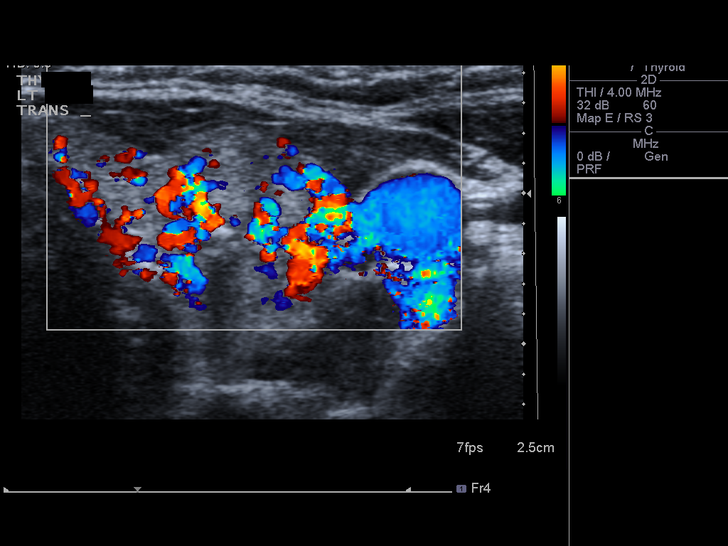
[im 5/14]
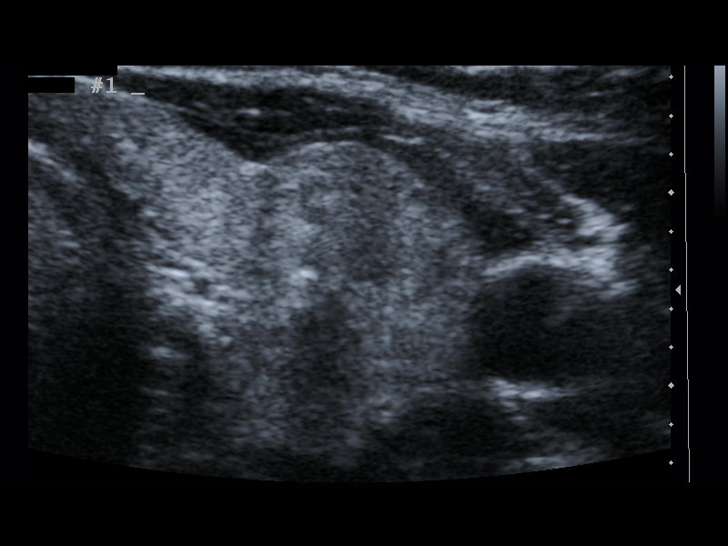
[im 6/14]
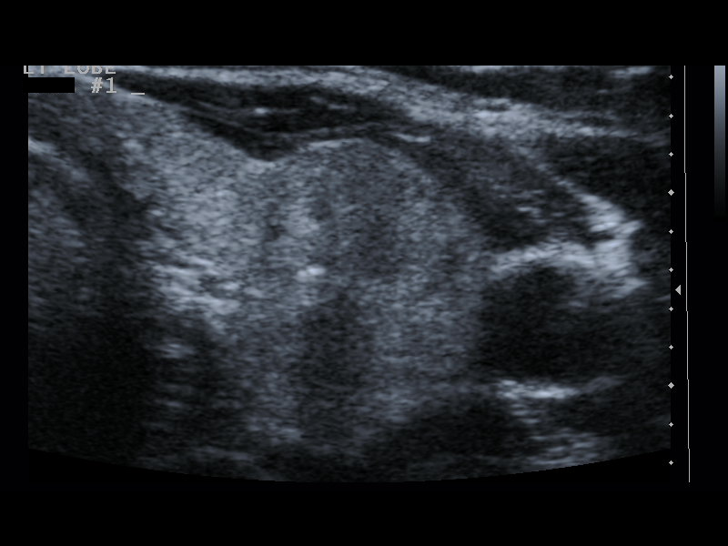
[im 8/14]
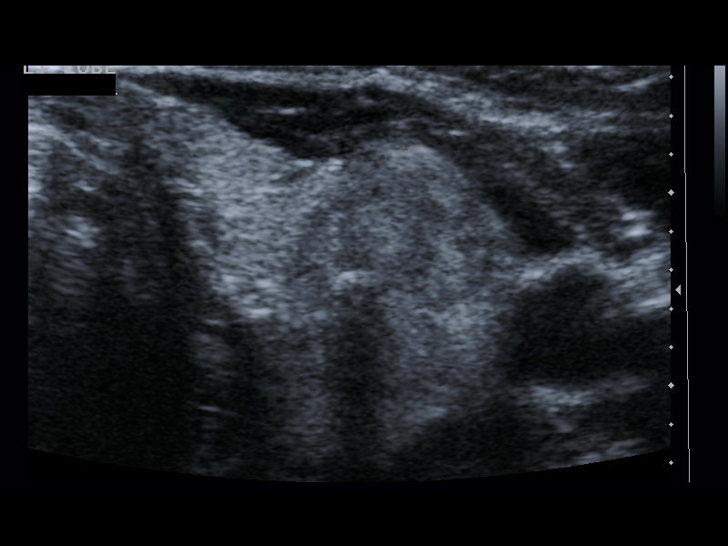
[im 9/14]
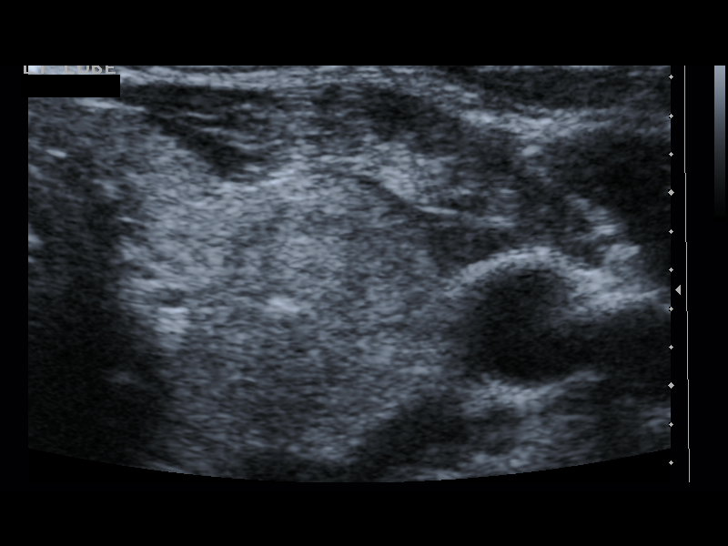
[im 10/14]
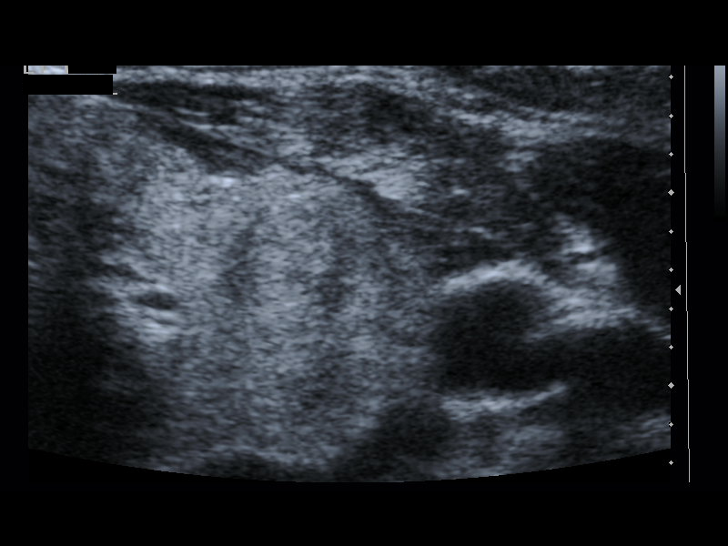
[im 11/14]
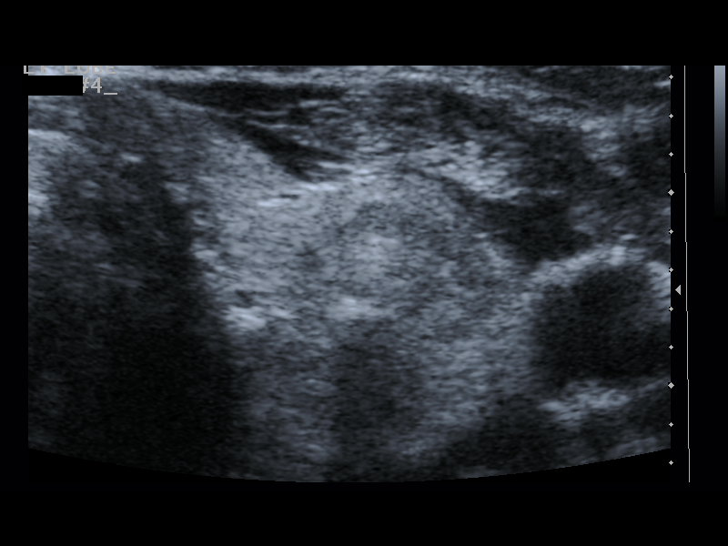
[im 12/14]
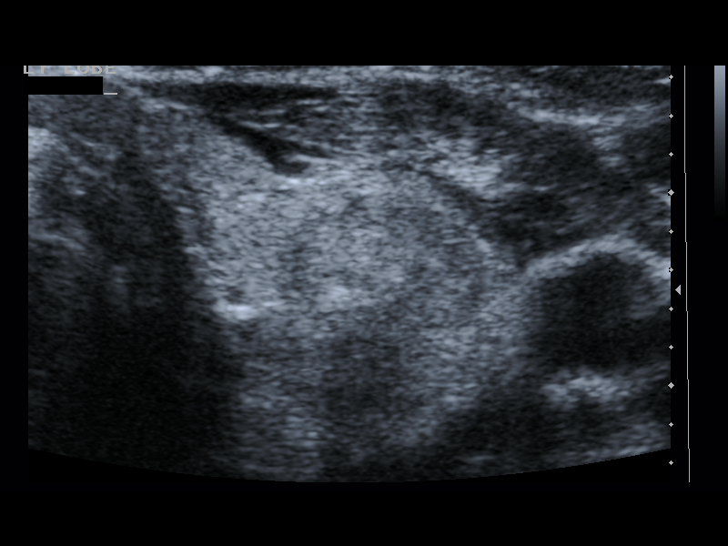
[im 13/14]
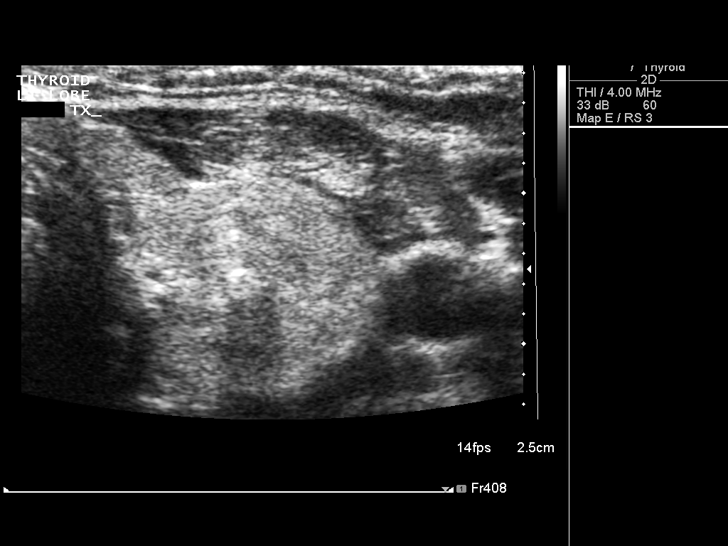
[im 14/14]
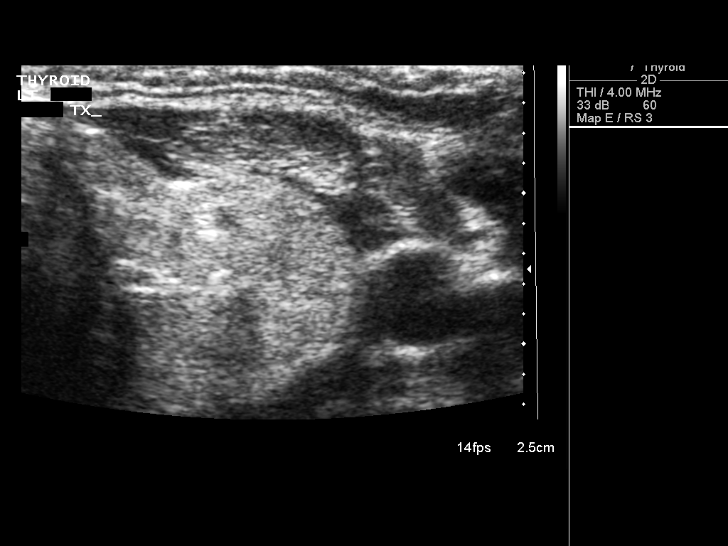

[13 of 14 positions shown; findings below may reference images not displayed]

Pre-procedural ultrasound scanning demonstrated grossly unchanged
appearance of mixed echogenic nearly isoechoic solid nodule within
the left lobe of the thyroid which contains several punctate
shadowing calcifications.

The procedure was planned. The neck was prepped in the usual sterile
fashion, and a sterile drape was applied covering the operative
field. A timeout was performed prior to the initiation of the
procedure. Local anesthesia was provided with 1% lidocaine.

Under direct ultrasound guidance, 4 FNA biopsies were performed of
the dominant left-sided nodule with a 25 gauge needle. The samples
were prepared and submitted to pathology.

Limited post procedural scanning was negative for hematoma or
additional complication. Dressings were placed. The patient
tolerated the above procedures procedure well without immediate
postprocedural complication.
IMPRESSION: Technically successful ultrasound guided fine needle aspiration of
dominant left-sided nodule.

## 2015-07-29 IMAGING — US US SOFT TISSUE HEAD/NECK
1 series · 14 of 25 positions shown · non-contrast
Comparison: 06/13/2013 and earlier studies

CLINICAL DATA: thyroid nodules 6 mo f/u, previous FNA biopsy of
dominant left lesion 06/13/2013

EXAM:
THYROID ULTRASOUND
TECHNIQUE: Ultrasound examination of the thyroid gland and adjacent soft
tissues was performed.

[Series 1: us soft tissue head/neck · 0.07mm/px · 14 of 46 slices shown]
[im 1/46]
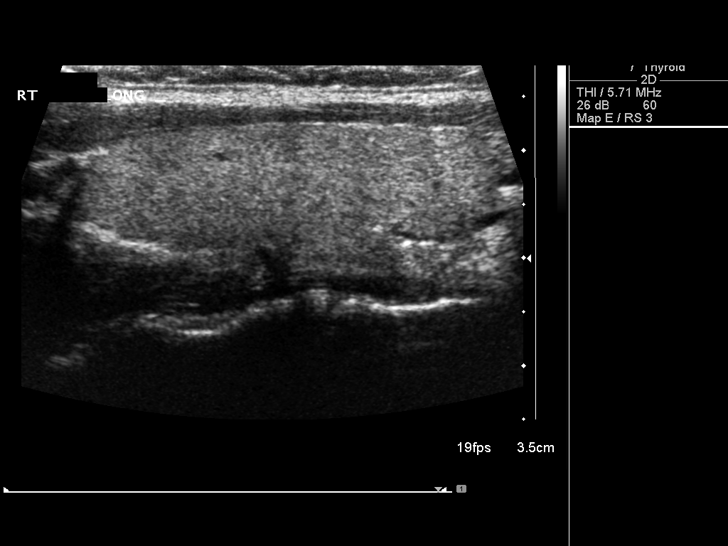
[im 4/46]
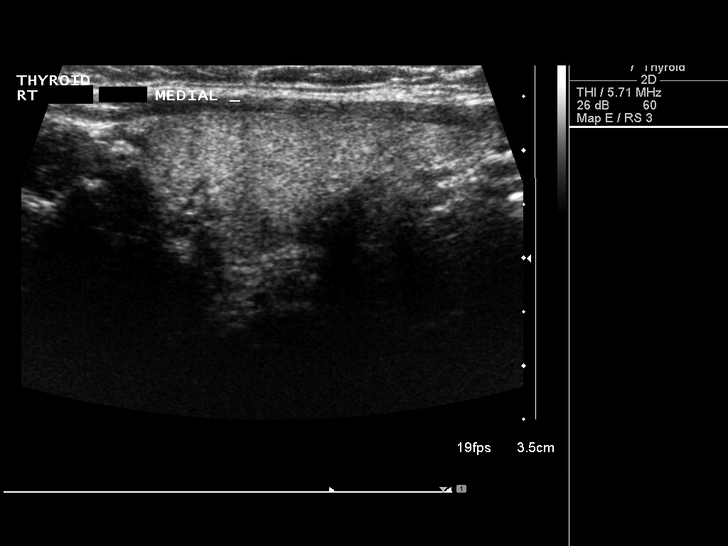
[im 8/46]
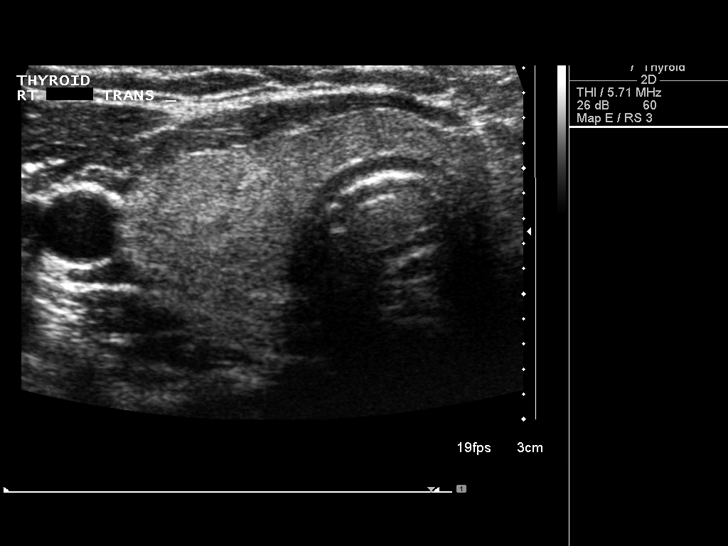
[im 12/46]
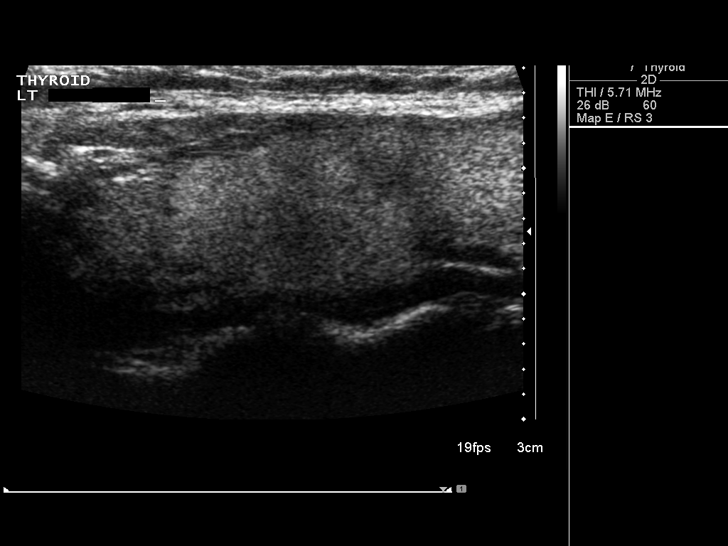
[im 16/46]
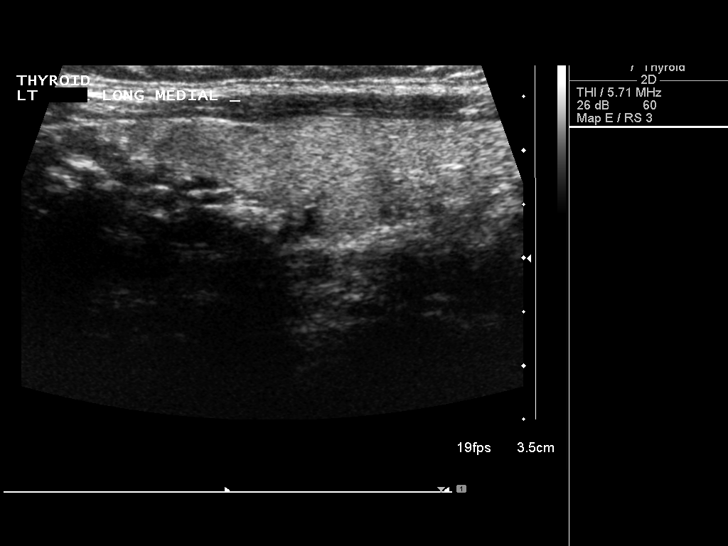
[im 17/46]
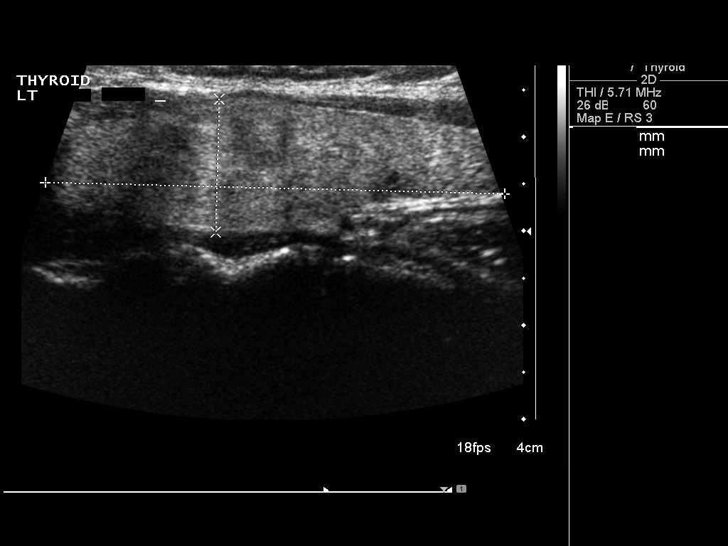
[im 21/46]
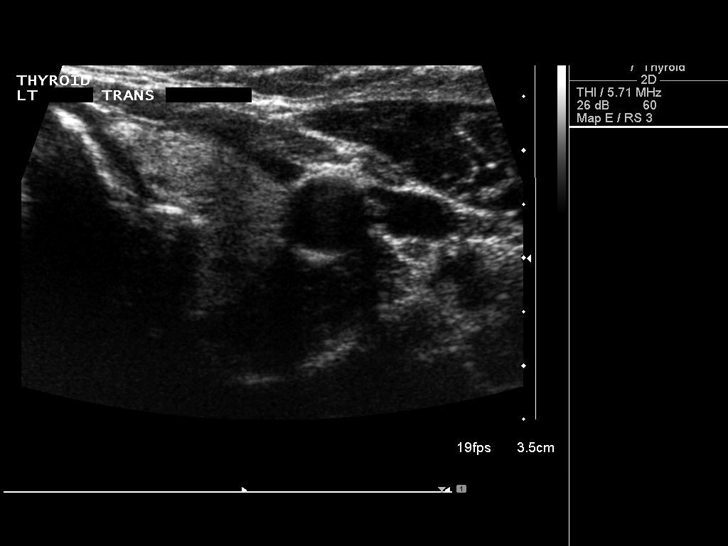
[im 25/46]
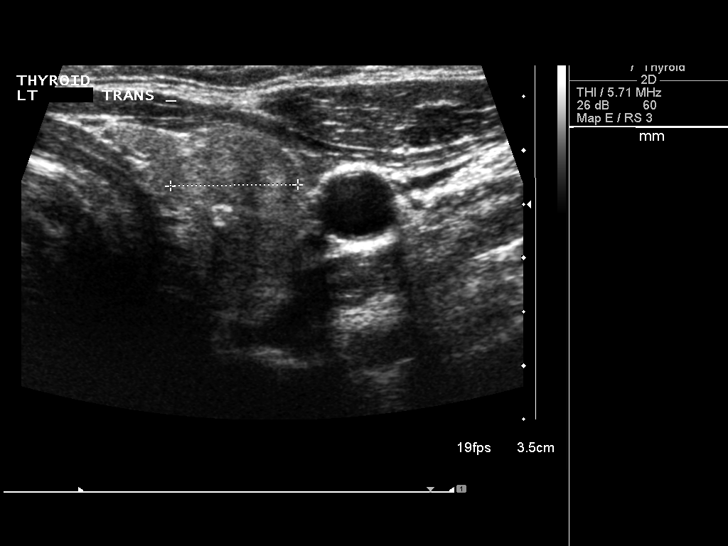
[im 29/46]
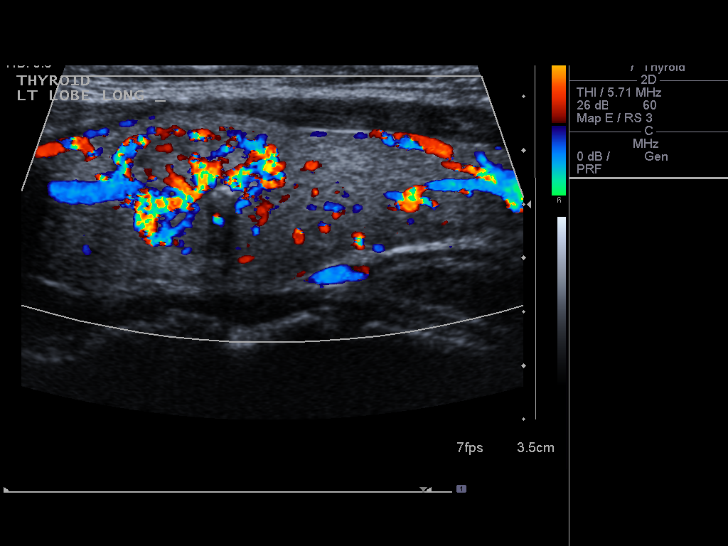
[im 31/46]
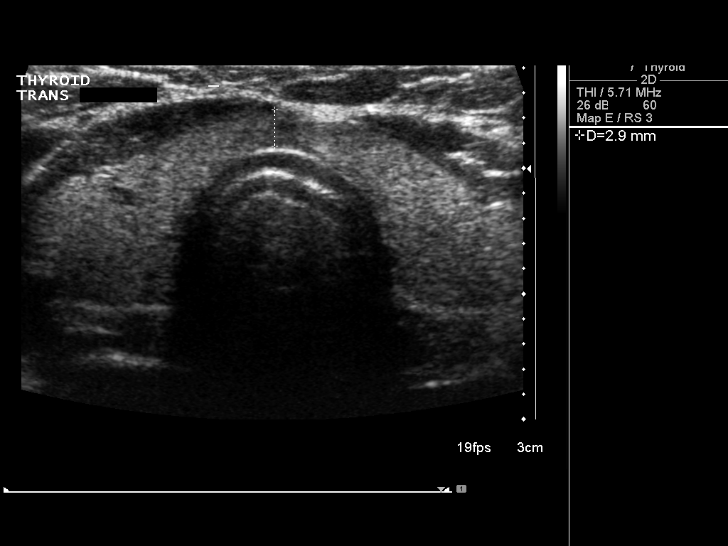
[im 34/46]
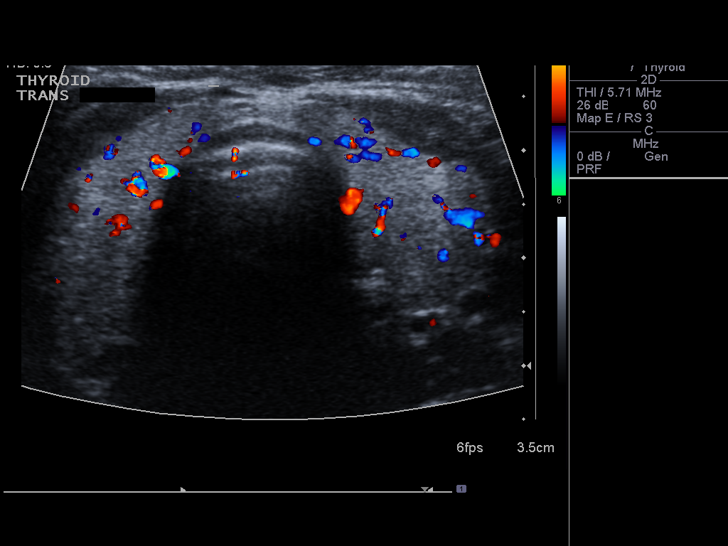
[im 38/46]
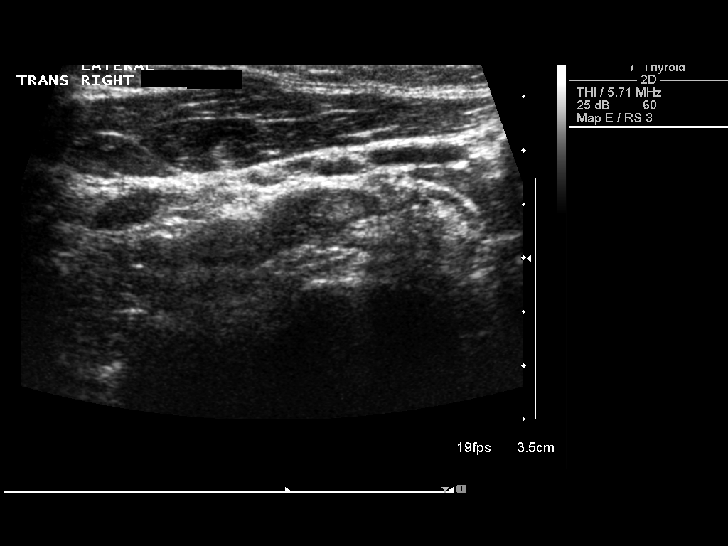
[im 42/46]
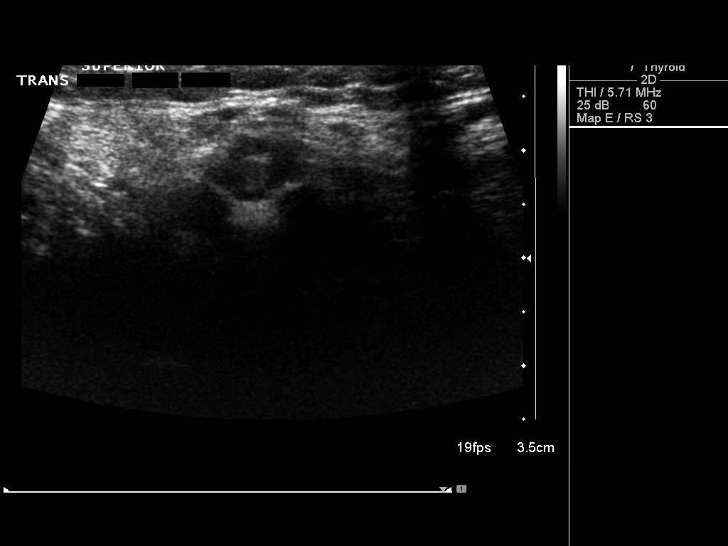
[im 46/46]
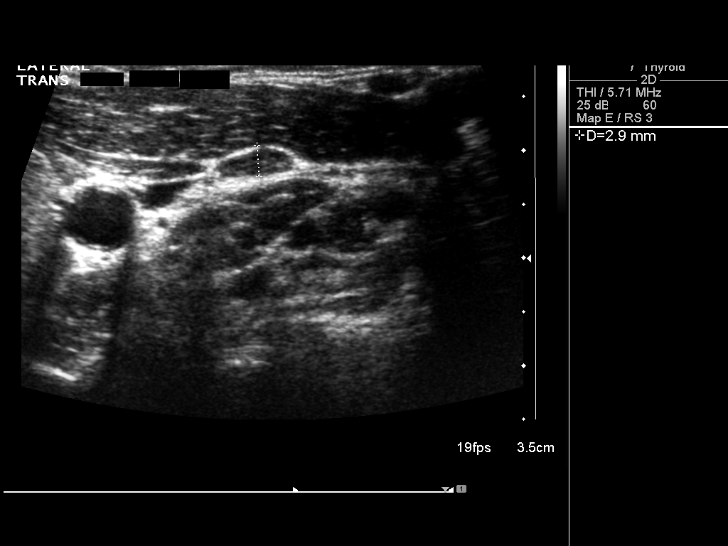

[14 of 25 positions shown; findings below may reference images not displayed]

FINDINGS: Right thyroid lobe

Measurements: 44 x 13 x 15 mm.  No nodules visualized.

Left thyroid lobe

Measurements: 52 x 13 x 18 mm.. There is a poorly marginated 16 x 13
x 12 mm nodule with coarse calcification, mid lobe (previously 16 x
16 x 13).

Isthmus

Thickness: 3 mm.  No nodules visualized.

Lymphadenopathy

None visualized.
IMPRESSION: 1. Stable solitary left thyroid nodule. Correlate with previous
biopsy results.
